# Patient Record
Sex: Female | Born: 1970 | Race: White | Hispanic: No | Smoking: Never smoker
Health system: Southern US, Community
[De-identification: ages and names within clinical notes are randomized; demographics above are authoritative.]

## PROBLEM LIST (undated history)

## (undated) DIAGNOSIS — E079 Disorder of thyroid, unspecified: Secondary | ICD-10-CM

## (undated) DIAGNOSIS — K219 Gastro-esophageal reflux disease without esophagitis: Secondary | ICD-10-CM

## (undated) DIAGNOSIS — I10 Essential (primary) hypertension: Secondary | ICD-10-CM

## (undated) DIAGNOSIS — I639 Cerebral infarction, unspecified: Secondary | ICD-10-CM

## (undated) HISTORY — PX: HERNIA REPAIR: SHX51

## (undated) HISTORY — PX: LYMPH NODE BIOPSY: SHX201

## (undated) HISTORY — PX: APPENDECTOMY: SHX54

## (undated) HISTORY — PX: TONSILLECTOMY: SUR1361

## (undated) HISTORY — PX: ABDOMINAL HYSTERECTOMY: SHX81

---

## 2002-04-21 ENCOUNTER — Encounter: Payer: Self-pay | Admitting: Urology

## 2002-04-21 ENCOUNTER — Encounter (INDEPENDENT_AMBULATORY_CARE_PROVIDER_SITE_OTHER): Payer: Self-pay | Admitting: *Deleted

## 2002-04-21 ENCOUNTER — Ambulatory Visit (HOSPITAL_COMMUNITY): Admission: RE | Admit: 2002-04-21 | Discharge: 2002-04-21 | Payer: Self-pay | Admitting: Urology

## 2002-06-09 ENCOUNTER — Inpatient Hospital Stay (HOSPITAL_COMMUNITY): Admission: RE | Admit: 2002-06-09 | Discharge: 2002-06-12 | Payer: Self-pay | Admitting: Urology

## 2002-06-09 ENCOUNTER — Encounter: Payer: Self-pay | Admitting: Urology

## 2002-08-25 ENCOUNTER — Encounter: Payer: Self-pay | Admitting: Urology

## 2002-08-25 ENCOUNTER — Ambulatory Visit (HOSPITAL_COMMUNITY): Admission: RE | Admit: 2002-08-25 | Discharge: 2002-08-25 | Payer: Self-pay | Admitting: Urology

## 2002-09-05 ENCOUNTER — Ambulatory Visit (HOSPITAL_COMMUNITY): Admission: RE | Admit: 2002-09-05 | Discharge: 2002-09-05 | Payer: Self-pay | Admitting: Urology

## 2004-02-29 ENCOUNTER — Ambulatory Visit (HOSPITAL_COMMUNITY): Admission: RE | Admit: 2004-02-29 | Discharge: 2004-02-29 | Payer: Self-pay | Admitting: Urology

## 2005-01-06 ENCOUNTER — Encounter: Admission: RE | Admit: 2005-01-06 | Discharge: 2005-01-06 | Payer: Self-pay | Admitting: Urology

## 2005-01-19 ENCOUNTER — Encounter: Admission: RE | Admit: 2005-01-19 | Discharge: 2005-01-19 | Payer: Self-pay | Admitting: General Surgery

## 2006-11-26 ENCOUNTER — Ambulatory Visit (HOSPITAL_BASED_OUTPATIENT_CLINIC_OR_DEPARTMENT_OTHER): Admission: RE | Admit: 2006-11-26 | Discharge: 2006-11-26 | Payer: Self-pay | Admitting: Urology

## 2007-08-26 ENCOUNTER — Ambulatory Visit (HOSPITAL_BASED_OUTPATIENT_CLINIC_OR_DEPARTMENT_OTHER): Admission: RE | Admit: 2007-08-26 | Discharge: 2007-08-26 | Payer: Self-pay | Admitting: Urology

## 2010-12-06 ENCOUNTER — Encounter: Payer: Self-pay | Admitting: General Surgery

## 2011-03-31 NOTE — Op Note (Signed)
NAMEBEYZA, BELLINO             ACCOUNT NO.:  [id number]   MEDICAL RECORD NO.:  [id number]          PATIENT TYPE:  AMB   LOCATION:  [facility]                         FACILITY:  [facility]   PHYSICIAN:  Jamison Neighbor, M.D.  DATE OF BIRTH:  1971/01/26   DATE OF PROCEDURE:  DATE OF DISCHARGE:                               OPERATIVE REPORT   PREOPERATIVE DIAGNOSIS:  Malfunctioning InterStim.   POSTOPERATIVE DIAGNOSIS:  Malfunctioning InterStim.   PROCEDURE:  Replacement first and second stage InterStim   SURGEON:  Jamison Neighbor, M.D.   ANESTHESIA:  Local with IV sedation.   COMPLICATIONS:  None.   DRAINS:  Foley catheter, removed postoperatively.   BRIEF HISTORY:  A 40 year old female with interstitial cystitis, has an  InterStim placed for the management of urgency, frequency and urge  incontinence.  The patient had some deterioration in her symptoms and  when we got an x-ray, we saw that the lead had migrated laterally.  The  patient has had over a 50-pound weight loss and it was thought that this  might make the lead move.  We also noted that it is now quite visible on  the skin surface and she is uncomfortable from the device being  superficial.  The patient is now to undergo replacement of the device  with a new first and second stage to see if we get her symptoms under  better control.  She understands the risks and benefits of the procedure  and gave full informed consent.   PROCEDURE:  After adequate IV sedation, the patient was placed in the  prone position.  She was prepped for a full 10-minutes with scrub and  paint.  She received preoperative Ancef and gentamicin.  She was prepped  with sterile towels as well as a Vi-Drape.  The previous incision was  opened and the generator was identified.  It was removed from the  pocket.  Fluoroscopy was used to identify the approximate level of the  third sacral foramen, which was marked out on the skin surface.  The old  lead was  removed.  A foramen needle was passed into what was first  thought to be the third sacral foramen on each side.  Testing, however,  did not show dorsiflexion of the great toe or a bellows effect, and  imaging made it look as if this was most likely S4.  The needles were  then moved one foramen up and a good bellows effect was obtained  bilaterally.  There was a better affect and a better image on the left-  hand side and it was decided to go ahead and use that lead.  A small  incision was made and a wire was passed down through the needle, which  was removed.  The dilating trocar was then passed over the wire and the  wire was then removed.  The quadripolar lead was then passed down  through the trocar and placed down within the foramen.  It was  positioned so that the optimal bellows effect was obtained primarily on  leads II and III.  This was then  tunneled over to the old incision.  The  device was not placed into the same pocket in order to prevent biofilm  contamination.  The area had been irrigated with antibiotic solution.  The connection was made and the device was placed in the new  pocket.  The incision was closed with 2-0 Vicryl as well as 3-0 nylon.  The patient tolerated the procedure well and was taken to the recovery  room in good condition.  She was sent home with Keflex and Lorcet Plus.  She will return to see me in routine follow-up in 1 week for suture  removal.      Jamison Neighbor, M.D.  Electronically Signed     RJE/MEDQ  D:  08/26/2007  T:  08/27/2007  Job:  [id number]

## 2011-04-03 NOTE — Discharge Summary (Signed)
Marisa Hoffman, Marisa Hoffman                       ACCOUNT NO.:  [id number]   MEDICAL RECORD NO.:  [id number]                   PATIENT TYPE:  INP   LOCATION:  0352                                 FACILITY:  [facility]   PHYSICIAN:  Jamison Neighbor, M.D.               DATE OF BIRTH:  15-Feb-1971   DATE OF ADMISSION:  06/09/2002  DATE OF DISCHARGE:  06/12/2002                                 DISCHARGE SUMMARY   ADMISSION DIAGNOSIS:  Pelvic mass.   SECONDARY DIAGNOSES:  1. Interstitial cystitis.  2. Hypertension.  3. Esophageal reflux.  4. Fibromyalgia.  5. Depression.   DISCHARGE DIAGNOSES:  1. Multiple pelvic adhesions.  2. Interstitial cystitis.  3. Hypertension.  4. Esophageal reflux.  5. Fibromyalgia.  6. Depression.   PROCEDURE:  Abdominal exploration, lysis of adhesions, cystoscopy, and  ureteral catheterization done on the day of admission.   HISTORY OF PRESENT ILLNESS:  This 40 year old female is referred to me for  evaluation of chronic abdominal pain thought to be due to IC.  The patient  underwent cystoscopy which demonstrated that she did indeed have IC, but had  no improvement in her pain.  Careful evaluation showed that the pain is  localized on the left-hand side, and we did recommend that she undergo a CT  scan.  The CT scan showed that she had a probable adnexal mass.  This was  described as a cystic obstruction on the left-hand side, possibly involving  the bladder or ureter.  The patient is now to undergo abdominal exploration  to determine the etiology of this lesion.   PAST MEDICAL HISTORY:  1. Interstitial cystitis.  2. Irritable bowel syndrome.  3. Hypertension.  4. Fibromyalgia.   PAST SURGICAL HISTORY:  1. Cesarean section.  2. D&C.  3. Hysterectomy.  4. Cholecystectomy.  5. Multiple cystoscopies.    MEDICATIONS:  1. Elmiron.  2. Bextra.  3. Allegra.  4. Propanolol.  5. Klonopin.  6. Lipitor.  7. Prevacid.  8. Cystospaz.  9. Reglan.  10.      Amitriptyline.  11.      BuSpar.  12.      Effexor.  13.      Lorcet.  14.      Estradiol.  15.      Nasacort.  16.      Maalox.   PHYSICAL EXAMINATION:  The patient's physical examination is consistent with  the above description with the only positive finding being significant left  lower quadrant pain secondary to possible mass and possible adhesions.   HOSPITAL COURSE:  The patient was taken to the operating room on 06/09/02,  where exploration was performed.  She had a cysto and stent placed in order  to allow visualization of the ureter.  Adhesions were taken down, and it  became clear that what was described as being a cystic mass was simply a  fluid collection surrounded by  adhesed bowel, all of which was cleared after  the adhesions had been taken down.  The patient had an unremarkable  postoperative period.  She actually felt much better once the adhesions had  been taken down, and felt that the pain that she had prior to admission had  essentially cleared.  The patient was felt to be ready for discharge by  06/12/02.  Her pain was well controlled.  The IC pain and incisional pain  were still present, but the left lower quadrant pain that necessitated the  surgery had cleared.  She was eating without difficulty, and ambulating  without difficulty, and bowels were moving.  The patient was discharged with  no new prescriptions other than Lorcet 10 for pain.  She will stay on all of  her other present medications.   FOLLOWUP:  She will return to me for followup for staple removal.                                                Jamison Neighbor, M.D.    RJE/MEDQ  D:  07/06/2002  T:  07/06/2002  Job:  [id number]   cc:   Raquel James, M.D.  [place], [place]

## 2011-04-03 NOTE — Op Note (Signed)
Marisa Hoffman, BOGGESS                       ACCOUNT NO.:  [id number]   MEDICAL RECORD NO.:  [id number]                   PATIENT TYPE:  AMB   LOCATION:  DAY                                  FACILITY:  [facility]   PHYSICIAN:  Jamison Neighbor, M.D.               DATE OF BIRTH:  December 06, 1970   DATE OF PROCEDURE:  08/25/2002  DATE OF DISCHARGE:                                 OPERATIVE REPORT   PREOPERATIVE DIAGNOSES:  1. Interstitial cystitis.  2. Urgency incontinence.  3. Intermittent retention.   POSTOPERATIVE DIAGNOSES:  1. Interstitial cystitis.  2. Urgency incontinence.  3. Intermittent retention.   PROCEDURE:  First stage InterStim implant (two leads).   SURGEON:  Jamison Neighbor, M.D.   ANESTHESIA:  IV sedation plus local.   COMPLICATIONS:  None.   DRAINS:  None.   BRIEF HISTORY:  This 40 year old female is completely disabled from her  lower urinary tract symptoms.  She has chronic interstitial cystitis.  Her  pain is so severe that she does have episodes of urinary retention secondary  to pelvic floor dysfunction.  She also has marked urgency incontinence which  has not responded to medical management.  The patient is an excellent  candidate for Inter-Stim implantation.  She understands the risks and  benefits of the procedure.  She realizes this is a first stage approach, and  there is no guarantee that the implant will work as hoped for and that if  she does not respond, the second stage implant will not be placed.  She is  aware of the fact that she will have external wires exiting and will be  required to monitor her results in the postoperative period to determine if  the second stage is appropriate.  Full and informed consent was obtained.  She has taken preoperative antibiotics and had Hibiclens showers in  preparation for the procedure.   DESCRIPTION OF PROCEDURE:  After the successful induction of general  anesthesia, the patient was placed in the prone  position.  She was given  Ancef and gentamycin.  She had a full 10 minute prep with scrub and paint.  The patient was then secured to the table with tape opening up the buttocks  to allow the external sphincter to be visualized.  Standard drapes were  applied along with a Vi-Drape.  Fluoroscopy was used to identify the level  of the third sacral foramen.  A needle was passed on both sides, and  fluoroscopy seemed to indicate that this was a good placement.  The patient  had a better bellows effect on the left-hand side as opposed to the right.  A small incision was made, and the dilator system was then used to dilate a  track down through the incision to the foramen.  The quadripolar lead was  then partially deployed and positioned using the dilator system so that the  patient had a bellows  effect on leads 1, 2, and 3, with some lower effect on  lead 0.  This was considered to be optimal placement based on both the  patient's effect as well as the fluoroscopic appearance on the lateral film.  A PA film was taken, and it did appear that this was somewhat lower than one  normally sees with the third sacral foramen, and there was concern that  perhaps this was in the fourth sacral foramen.  For that reason, a second  lead was then placed one foramen higher.  The patient had a nearly identical  response in terms of bellows effect, making it difficult to ascertain on  clinical grounds which of these was actually the more optimal lead  placement.  The decision was made to leave a quadripolar lead on each side.  The second more superiorly was deployed in an identical fashion.  A small  incision was made out laterally, and the two leads were brought out through  this lateral incision.  Both leads were then connected to lead extenders  using the standard connection, and these were covered with a protective  booty.  The booties were tied down with silk sutures.  The lower of the two  leads was  brought out through a puncture hole on the right buttocks.  The  higher one was brought out just above that lead, so that it will be easy to  identify which lead works better for the patient.  The connections were then  placed down within the incision within a small pocket that was made.  The  two paramedian incisions were closed with a single stitch of nylon suture.  The lateral incision was closed with two stitches of nylon suture placed in  a mattress fashion.  The patient had a dressing applied.  She tolerated the  procedure well and was taken to the recovery room in good condition.                                               Jamison Neighbor, M.D.    RJE/MEDQ  D:  08/25/2002  T:  08/25/2002  Job:  [id number]   cc:   Grace Bushy, M.D.  [place], [place]

## 2011-04-03 NOTE — Op Note (Signed)
[facility]  Patient:    Marisa Hoffman, Marisa Hoffman Visit Number: [id number] MRN: [id number]          Service Type: Attending:  Jamison Neighbor, M.D. Dictated by:   Jamison Neighbor, M.D. Proc. Date: 04/21/02   CC:         Alycia Patten, M.D., [place], [place]   Operative Report  PREOPERATIVE DIAGNOSIS:  Interstitial cystitis.  POSTOPERATIVE DIAGNOSIS:  Interstitial cystitis.  PROCEDURE: 1. Cystoscopy. 2. Urethral calibration. 3. Hydrodistention of the bladder, 4. Marcaine and Pyridium instillation, 5. Marcaine and Kenalog injection.  SURGEON:  Jamison Neighbor, M.D.  ANESTHESIA:  General.  COMPLICATIONS:  None.  DRAINS:  None.  BRIEF HISTORY:  This 40 year old female was first evaluated in October 2002 and then once again in April 2003.  The patient has severe interstitial cystitis and has not responded to a very aggressive medical management protocol.  The patient elected to just stop everything.  She had previously been on a program of Neurontin, hydroxyzine, Singulair, Flexeril, Elmiron, Lortab, Celexa, Prevacid, Estradiol, Lipitor, and propanolol.  This did make her somewhat sedated, and obviously this was more medication than she probably requires, but I have stressed to her that she really needs to be on a better thought out plan, looking at something along the lines of hydroxyzine, Elmiron, and Elavil along with whatever other medications such as Pyridium Plus and pain medications she needs for symptomatic management.  It was thought that the patient would be a candidate for InterStim implant since she has poorly-controlled urgency incontinence and frequency, but Medicaid will not cover that device.  The patient is interested in a repeat hydrodistention to determine if she is a candidate for any additional experimental drug trials.  We do feel that she may very well be a candidate for RTX.  The patient is to undergo diagnostic cystoscopy to  determine if she will qualify. She understands the risks and benefits of the procedure.  We hope that she will get some symptomatic relief, but she realizes there is no guarantee that that will occur.  In addition, she knows that she will certainly have increasing pain for the next week or two following the procedure itself.  DESCRIPTION OF PROCEDURE:  After the successful induction of general anesthesia, the patient was placed in the dorsal lithotomy position and prepped with Betadine and draped in the usual sterile fashion.  Careful bimanual examination revealed no abnormalities of the urethra.  Specifically, no evidence of a diverticulum.  There was no real cystocele, rectocele, or enterocele.  There were no mass on bimanual exam.  The uterus was palpably normal. There were no adnexal masses.  The urethra was calibrated to 32 with female urethral sounds  There were no signs of stenosis or stricture.  The cystoscope was inserted.  The bladder was carefully inspected.  It was free of any tumor or stones.  Both ureteral orifices were normal in configuration and location.  The bladder was distended at a pressure of 100 cm of water for five minutes.  When the bladder was drained, the patient had a bladder capacity of just 500 cc.  There was marked bleeding and glomerulation formation throughout all four quadrants consistent with a fairly marked case of interstitial cystitis.  A bladder biopsy was sent for mast cell analysis which may lead [place] to use higher doses of antihistamine.  The patient had the biopsy site cauterized.  She had an instillation of Marcaine and Pyridium.  She was given intraoperative  Toradol and Zofran as well as a B&O suppository.  She had an Marcaine and Kenalog periurethrally.  The patient will be sent home today with Lorcet Plus, Pyridium Plus, and Levaquin.  We will bring her back and, as described above, will encourage her to get on a standard medical protocol  but will try to eliminate some of the more sedating aspects of the drugs.  She is a candidate for the RTX trial.  Unfortunately, that is the only clinical trial for IC open at this time. Dictated by:   Jamison Neighbor, M.D. Attending:  Jamison Neighbor, M.D. DD:  04/21/02 TD:  04/24/02 Job: [id number]

## 2011-04-03 NOTE — Op Note (Signed)
Marisa Hoffman, Marisa Hoffman             ACCOUNT NO.:  [id number]   MEDICAL RECORD NO.:  [id number]          PATIENT TYPE:  AMB   LOCATION:  [facility]C                         FACILITY:  [facility]   PHYSICIAN:  Jamison Neighbor, M.D.  DATE OF BIRTH:  Nov 04, 1971   DATE OF PROCEDURE:  11/26/2006  DATE OF DISCHARGE:                               OPERATIVE REPORT   SERVICE:  Urology.   PREOPERATIVE DIAGNOSIS:  Interstitial cystitis.   POSTOPERATIVE DIAGNOSIS:  Interstitial cystitis.   PROCEDURE:  Cystoscopy, urethral calibration, hydrodistention of the  bladder, Marcaine and Pyridium installation, Marcaine and Kenalog  injection.   SURGEON:  Dr. Logan Bores.   ANESTHESIA:  General.   COMPLICATIONS:  None.   DRAINS:  None.   BRIEF HISTORY:  This 40 year old female has a severe case of  interstitial cystitis.  The patient had an InterStim placed in the past  and had a nice response in terms of her frequency and urgency. Recently  however, she has had some problems with the device and says that she  feels it is causing some problems with her bowels and she feels it may  be adding some pain.  PA and lateral films showed good placement of the  lead in S3 but there has been some medial to lateral migration of the  wire. The patient has been reprogrammed with some improvement but she is  having severe issues with both her irritable bowel syndrome and her  interstitial cystitis.  The patient has numerous medications but despite  that has chronic abdominal pain, chronic pelvic pain and frequency and  urgency. The patient has asked that she undergo a repeat  hydrodistention. She elected as opposed to formal revision of the  InterStim lead because she has responded in the past.  She is fully  aware of the fact that there is no guarantee that she will have  improvement and if she does have improvement it certainly is not  permanent. She has agreed that if this does not work, she will consider  InterStim  revision and our plan will be for cystoscopy and  hydrodistention today along with continuation or her other medications.  The patient gave full informed consent.   PROCEDURE:  After successful induction of general anesthesia, the  patient was placed in the dorsal lithotomy position, prepped with  Betadine and draped in the usual sterile fashion. Support for urethra  was reasonable, there was no signs of a diverticulum or any stenosis.  She accepted a 32-French female urethral sound with no signs of  stricture.  The patient had no real prolapse, the vault was well-  supported.  There was no discharge of any kind and her pelvic exam was  felt to be normal. The cystoscope was inserted, the bladder was  carefully inspected and was free of any tumor or stones.  Both ureteral  orifices were normal in configuration and location. Clear urine was seen  to efflux from each. Hydrodistention of the bladder was performed.  The  bladder was distended at a pressure of 100 cmH2O for 5 minutes. When the  bladder was drained, glomerulations  could be seen throughout the  bladder.  The patient had a bladder capacity of 575 which is the average  capacity for an interstitial cystitis patient but is markedly less than  the normal bladder capacity which would be approximately 1150 under  anesthesia.  The patient had nothing that requires biopsy. The bladder  was drained and a mixture of Marcaine and Pyridium was left in the  bladder, Marcaine and Kenalog were injected periurethrally. The patient  tolerated the procedure well and was taken to the recovery room in good  condition.  She will return to the office in follow-up at which point we  will make a decision as to whether she is going to need any additional  InterStim revision. I have given and Pyridium Plus as well as a small  number of Percocet.  She will return back to her standard pain medicine  of Lorcet Plus once that has been completed. She does have  Cipro at home  for a short course of antibiotic coverage.           ______________________________  Jamison Neighbor, M.D.  Electronically Signed     RJE/MEDQ  D:  11/26/2006  T:  11/26/2006  Job:  [id number]

## 2011-04-03 NOTE — Op Note (Signed)
Marisa Hoffman, Marisa Hoffman                       ACCOUNT NO.:  [id number]   MEDICAL RECORD NO.:  [id number]                   PATIENT TYPE:  AMB   LOCATION:  DAY                                  [facility]ACILITY:  [facility]   PHYSICIAN:  Jamison Neighbor, M.D.               DATE OF BIRTH:  03/29/1971   DATE OF PROCEDURE:  09/05/2002  DATE OF DISCHARGE:                                 OPERATIVE REPORT   PREOPERATIVE DIAGNOSIS:  Urgency and frequency syndrome.   POSTOPERATIVE DIAGNOSIS:  Urgency and frequency syndrome.   PROCEDURE:  Revision of first stage InterStim implant and second stage  implant.   SURGEON:  Jamison Neighbor, M.D.   ANESTHESIA:  General.   COMPLICATIONS:  None.   DRAINS:  None.   HISTORY:  This 40 year old female had a first stage InterStim implant placed  two weeks ago.  The patient has had a remarkable response to the InterStim  with excellent control of her urgency and frequency.  She is now sleeping  through the night.  Her pain has gotten better, and this is the best she has  felt in many years.  The patient had two first stage leads placed because of  variable response, and it turned out that the lower of the leads turned out  to be the better of the two.  The patient is now to undergo revision of the  upper lead as well as attachment of the lower lead to the [facility] generator.  She understands the risks of the procedure and gave full and informed  consent.   DESCRIPTION OF PROCEDURE:  The patient was brought to the operating room  table in the prone position and was then given a four minute prep and drape.  She was preoperative antibiotic therapy with Ancef and gentamycin.  She then  was prepped and draped.  The existing wires were clipped at the level of the  skin and were clamped in an effort to prevent them from being pulled into  the incision.  A Vi-Drape was placed.  The lateral incision was then opened  and extended, and the connectors to both leads were  identified.  They were  both freed from the surrounding tissue with electrocautery.  The leads were  then tugged on in order to determine which one was the proximal and which  one was the distal lead.  The lower of the two leads was then disconnected  and was then connected to the lead extender in the IPG in the usual fashion.  The booty was placed and tied down with silk sutures.  The external portion  of that lead was then cut and removed.  The other lead was cut, but the lead  itself was left in place in case in a future time, the patient requires that  lead for additional stimulation.  The incision was then deepened, and a  pocket was made.  The entire area was irrigated out with antibiotic  solution.  Hemostasis was obtained.  The IPG was placed with the writing  side up in the depths of the pocket.  The incision was then closed with 2-0  Vicryl and surgical clips.  The patient tolerated the procedure well and was  taken to the recovery room in good condition.                                               Jamison Neighbor, M.D.    RJE/MEDQ  D:  09/05/2002  T:  09/05/2002  Job:  [id number]

## 2011-04-03 NOTE — H&P (Signed)
[facility]  Patient:    Marisa Hoffman, [place] Visit Number: [id number] MRN: [id number]          Service Type: SUR Location: 3W 0352 01 Attending Physician:  Londell Moh Dictated by:   Jamison Neighbor, M.D. Admit Date:  06/09/2002   CC:         Dr. Raquel James   History and Physical  SERVICE:  Urology.  ADMITTING DIAGNOSES:  1. Interstitial cystitis.  2. Chronic irritable bowel syndrome.  3. Chronic pelvic pain.  4. Possible adnexa mass.  5. Hypertension.  6. Tachycardia.  7. Depression.  8. Migraines.  9. Possible hyperthyroidism. 10. Esophageal reflux disease. 11. Fibromyalgia.  HISTORY OF PRESENT ILLNESS:  This 40 year old female was initially referred to me for evaluation of chronic abdominal pain, thought to be due to severe interstitial cystitis. The patient underwent a cystoscopic examination which demonstrated she did indeed have this disease, but unfortunately she had no real improvement in her symptoms. The patient is being considered for numerous long-term of options for the management of her chronic pain, which includes experimental drug trials, InterStim implantation and have even included discussion of possible cystectomy. The patient cannot have the InterStim implanted because she has Medicaid and they do not cover this. The patient may be a candidate for the experimental drug trials if her pain does not improve.  After the patients recent cystoscopy and hydrodistention, she presented to the emergency room in her home town where a CT scan was obtained and she was told she had adnexa mass. This was reviewed and ultrasound again showed there did appear to e a cystic obstruction in the left side. This was thought to possible involve the bladder or ureter. The patient is to be admitted following abdominal exploration to determine the etiology of this lesion.  PAST MEDICAL HISTORY: Remarkable for her interstitial cystitis,  irritable bowel syndrome and fibromyalgia. The patient also suffers from hypertension, tachycardia, chronic allergies, migraines, depression, anxiety, hyperthyroidism, esophageal reflux disease.  PAST SURGICAL HISTORY: C-sections x2, D&C, hysterectomy, cholecystectomy, and multiple cystoscopies.  CURRENT MEDICATIONS:  1. Elmiron 200 mg b.i.d.  2. Bextra 10 mg daily.  3. Allegra 60 mg b.i.d.  4. Propranolol 20 mg 2 b.i.d.  5. Klonopin 0.5 mg 1/2 b.i.d.  6. Lipitor 40 mg daily.  7. Prevacid 30 mg daily.  8. Cystospaz on a p.r.n. basis.  9. Reglan 10 mg t.i.d. 10. Amitriptyline 75 mg at night. 11. BuSpar 5 mg 2 b.i.d. 12. Effexor 75 mg daily. 13. Lorcet as needed. 14. Estradiol 10 mg daily. 15. Nasacort 2 puffs daily. 16. Maalox p.r.n.  SOCIAL HISTORY: The patient does not use tobacco or alcohol and she follows an interstitial cystitis diet. She last had any tobacco products in 1990.  FAMILY HISTORY: Noncontributory.  REVIEW OF SYSTEMS: Pertinent for chronic headache, chronic congestion, urgency, frequency, nocturia to four times a night, chronic joint pain including chronic pain in the neck.  PHYSICAL EXAMINATION:  GENERAL: On examination, this is a well-developed, well-nourished female.  HEENT: Examination was normocephalic and atraumatic. Cranial nerves II-XII appear grossly intact.  NECK: Supple. No adenopathy or thyromegaly.  LUNGS: Clear.  HEART: Regular rate and rhythm. No murmurs, thrills, gallops, rubs, or heaves.  ABDOMEN: Soft and she had multiple tender points across the lower portion of the abdomen.  PELVIC: Examination showed no cystocele, rectocele or enterocele. There are no mass on bimanual examination.  EXTREMITIES: No clubbing, cyanosis, or edema.  IMPRESSION: Left lower quadrant pain  secondary to possible mass, possible adhesions.  PLAN: Pelvic exploration. Dictated by:   Jamison Neighbor, M.D. Attending Physician:  Londell Moh DD:   06/09/02 TD:  06/13/02 Job: [id number]

## 2011-04-03 NOTE — Op Note (Signed)
[facility]  Patient:    Marisa Hoffman, P[place]SYL Visit Number: [id number] MRN: [id number]          Service Type: SUR Location: 3W 0352 01 Attending Physician:  Londell Moh Dictated by:   Jamison Neighbor, M.D. Proc. Date: 06/09/02 Admit Date:  06/09/2002   CC:         Dr. Janeann Merl, Ardelle Balls, [place]   Operative Report  PREOPERATIVE DIAGNOSIS:  Chronic abdominal pain with cystic mass identified on CT.  POSTOPERATIVE DIAGNOSIS:  Chronic abdominal pain secondary to multiple pelvic adhesions.  PROCEDURES: 1. Cystoscopy. 2. Placement of left ureteral catheter. 3. Pelvic exploration. 4. Lysis of adhesions.  SURGEON:  Jamison Neighbor, M.D.  ASSISTANT:  Lindaann Slough, M.D.  ANESTHESIA:  General.  COMPLICATIONS:  None.  DRAINS:  Foley catheter.  BRIEF HISTORY:  This patient was originally referred to me for evaluation of interstitial cystitis.  The patient has such severe urgency and frequency that she is felt to be a potential candidate for Interstim implantation. Unfortunately, her Medicaid insurance will not cover this device.  The patient has been on very aggressive medical management, including Pyridium Plus, Neurontin, _____, Singulair, Flexeril, Elmiron, Celexa, Prevacid, estradiol, Lipitor, propranolol, and pain medications.  The patient underwent cystoscopy and hydrodistention without much improvement in her symptoms.  The patient subsequently presented to the emergency room in her home town of [place], where a CT scan was obtained thinking she might have a stone.  The patient was found to have a fluid collection in the left adnexa measuring 3 x 4 of undetermined etiology.  This was in the exact location of the patients pain. The patient does not have a palpable mass, but it should be noted that her body habitus makes it difficult to define the anatomy in that region.  The patients pain is in that general area, and for that reason  she is to undergo exploration to determine if this mass, which is next to her bladder, involves the bladder or ureter.  She gave full and informed consent.  DESCRIPTION OF PROCEDURE:  After successful induction of general anesthesia, the patient was placed in the dorsal lithotomy position and prepped with Betadine and draped in the usual sterile fashion.  Cystoscopy was performed. The bladder was carefully inspected and was free of any tumor or stone. Hydrodistention was not performed, but it is known from previous examination that the patient has a very small-capacity bladder.  The ureteral orifice was identified and using fluoroscopy, a stent was placed up to the kidney.  This was secured to a Foley catheter.  The patient was then placed in the supine position and prepped with Betadine and draped in the usual sterile fashion.  A previous Pfannenstiel incision was utilized and was used to allow entry into the abdominal cavity.  The _____ incision was somewhat larger than when the patient had her hysterectomy.  As the fascia was opened, there were numerous adhesions of the omentum to the underside of the abdominal wall, all of which were carefully taken down.  The Bookwalter retractor was placed.  The bowel contents were brought up superiorly.  The sigmoid colon was found to be tethered to the bladder.  These attachments were taken down.  The colon was also stuck to the sidewall on the left-hand side.  As these adhesions were taken down and the sigmoid was freed up, it was clear that there was a cystic potential space where the bowel had been trapped, and that  was in the general area where the ultrasound had demonstrated the 3 x 4 cm cystic structure.  The ovary was identified.  It was carefully inspected and was found to be free of any lesions.  The ureter was identified and was unremarkable.  The retroperitoneal space as well as the peritoneal space adjacent to the bladder was carefully  inspected and was found to be normal as well.  The bowel was traced all the way down to a point underneath the vagina, and that was free of any lesions.  It was felt that the mass seen on the previous studies was where the adhesed sigmoid colon had been tethered down, causing a potential space thought inadvertently by the radiologist to be an adnexal mass.  After all the adhesions had been taken down, the sigmoid colon was placed in a more appropriate position, and it is hoped that this will cause the patient to have less pain.  The rectus muscles were closed in the midline with Vicryl sutures. The incision itself was then closed with a running suture of #1 PDS and surgical clips.  The patients Foley catheter was left to straight drainage, but the stent was removed.  The patient tolerated the procedure well and was taken to the recovery room in good condition.  The Foley catheter will be left in for a day or so.  The patient will be sent home once she is stable. Dictated by:   Jamison Neighbor, M.D. Attending Physician:  Londell Moh DD:  06/09/02 TD:  06/13/02 Job: [id number]

## 2011-04-03 NOTE — Op Note (Signed)
NAMEENVI, Marisa Hoffman                       ACCOUNT NO.:  [id number]   MEDICAL RECORD NO.:  [id number]                   PATIENT TYPE:  AMB   LOCATION:  DAY                                  FACILITY[facility]  [facility]   PHYSICIAN:  Jamison Neighbor, M.D.               DATE OF BIRTH:  [birth year]   DATE OF PROCEDURE:  02/29/2004  DATE OF DISCHARGE:  02/29/2004                                 OPERATIVE REPORT   PREOPERATIVE DIAGNOSES:  Interstitial cystitis, Interstim malfunction.   POSTOPERATIVE DIAGNOSES:  Interstitial cystitis, Interstim malfunction.   PROCEDURE:  Interstim revision including old lead removal and placement of  new lead.   SURGEON:  Jamison Neighbor, M.D.   ASSISTANT:  Thyra Breed, MD   ANESTHESIA:  General endotracheal.   COMPLICATIONS:  None.   DRAINS:  Foley catheter removed in the PACU.   BRIEF HISTORY:  This is a 40 year old female documented in the past with  interstitial cystitis unable to obtain relief from her symptomatology from a  medical regimen.  Following placement of her Interstim device, she was doing  very well and significantly improved to the point she was no longer taking  pain medicine.  However, in early March, 2005, the patient took a fall and  said she lost her sensation.  The unit was then reprogrammed but the  Interstim device would no longer function properly.  Therefore the patient  is brought to the operating room today for probable new lead placement and  removal of her old leads with testing of her generator and reprogramming of  her Interstim device. The patient understands the risks, benefits, and  alternatives of this procedure and is willing to proceed.   DESCRIPTION OF PROCEDURE:  Following identification by her arm bracelet, the  patient was brought to the operating room. She was placed in the supine  position initially and after adequate IV sedation the patient was moved to  the prone position.  All pressure points were adequately  padded. The  buttocks were then pulled apart with tape in order to expose the anal  sphincter. The patient then received a full 10 minute prep with Betadine  soap scrub as well as Betadine paint. The patient was then draped in the  usual fashion including a Vi-drape.  Initial 5% Marcaine solution was then  injected into the skin prior to skin incision at the site of the old scar  from previous generator placement.  Once adequately anesthetized, the 15  blade scalpel was used to carry the incision down to the subcutaneous level  just above the generator.  Bovie electrocautery was used to obtain excellent  hemostasis and cauterize any subcutaneous bleeders.  The Bovie was then used  to cut through the pseudocapsule containing the generator and two leads  attached.  The generator was then delivered from the subcutaneous pocket and  an antibiotic soaked sponge was placed into the area.  The generator was  then removed from both leads.  The generator was then cleaned thoroughly and  tested to ensure the unit was still functioning properly and had adequate  battery reserve. Fluoroscopy was then used to identify the placement of the  previous two leads.  Both leads were then removed in their entirety through  the incision.  Spot fluoroscopy then showed no lead material remaining  within their tracks.  Utilizing fluoroscopy then a foramen needle was then  passed through what was thought to be the S3 foramen on the patient's right  side.  Testing on this lead showed that it was in fact in the third sacral  foramen with only minimal response on lead 2 and 3 including a bellows  effect.  A second foramen needle was passed about 1-2 cm above the first  needle also into the S3 foramen. This angle left trajectory appeared to be  somewhat better on both AP and lateral views fluoroscopically. Testing  revealed excellent bellows response with no dorsiflexion of the great toe in  leads 1, 2, and 3.  One  final lead was passed through the S3 foramen on the  left side. We then passed one final foramen needle through the S3 foramen on  the patient's right hand side.  There was excellent bellows effect without  dorsiflexion of the great toe; however, this needle was removed as we  decided to use the first needle on the same side as the generator.  An AP  film then showed a very appropriate medial to lateral curve to the needle  and it appeared it was optimally placed both on AP and lateral films.  The  guidewire was then passed down through the foramen needle and a small skin  incision was made around the needle. The dilating trocar was then passed  over the guidewire under fluoroscopic guidance.  The quadripolar lead was  then passed down through the trocar but the tines were not deployed.  The  lead was then positioned so that the bottom portion of the sacrum was  between leads 1 and 2.  In this position, leads 1, 2 and 3 all gave  excellent bellows effect and none gave dorsiflexion of the great toe.  Final  films were then obtained which confirmed good placement. The tunneling tool  was then used to make a tunnel from the paramedian small incision to the  initial incision where the generator was found.  The antibiotic soaked  sponge was removed from the pocket that would house the IPG.  The connection  was then made from the new quadripolar lead to an external lead extender  which was brought out through a small __________.  The protective booty was  then applied and it was tied down with four stitches of 1-0 silk. The  connection as well as the IPG were then placed within the pocket. This was  irrigated with antibiotic solution copiously.  The incision was then closed  in two layers. The subcutaneous tissue was reapproximated using a running 2-  0 Vicryl suture.  The skin was then closed with two 3-0 nylon vertical  mattress sutures. One vertical mattress suture was also placed in  the paramedian incision as well.  Sterile dressings were then applied to both  incisions.  The patient tolerated the procedure well and there were no  complications.  Please note that Dr. Logan Bores was present and participated in  the entire procedure as he was the responsible surgeon.  DISPOSITION:  After waking from general anesthesia, the patient will be  discharged to home. The patient was transported to the post anesthesia care  unit in stable condition. From here, she will be discharged to home.  She  will be instructed on how to use the Interstim.  She was given prescriptions  for Lortab 10 #40 as well as a 5 day prescription for Keflex.  In addition,  the patient desired a prescription for nausea. She was given Zofran instead  of Phenergan per the patient's request.  I also refilled her Elmiron.     Thyra Breed, MD                            Jamison Neighbor, M.D.    EG/MEDQ  D:  02/29/2004  T:  02/29/2004  Job:  [id number]

## 2011-08-27 LAB — POCT I-STAT 4, (NA,K, GLUC, HGB,HCT)
HCT: 38
Potassium: 3.3 — ABNORMAL LOW
Sodium: 138

## 2015-12-26 ENCOUNTER — Emergency Department (HOSPITAL_BASED_OUTPATIENT_CLINIC_OR_DEPARTMENT_OTHER): Payer: Medicaid Other

## 2015-12-26 ENCOUNTER — Encounter (HOSPITAL_BASED_OUTPATIENT_CLINIC_OR_DEPARTMENT_OTHER): Payer: Self-pay | Admitting: *Deleted

## 2015-12-26 ENCOUNTER — Emergency Department (HOSPITAL_BASED_OUTPATIENT_CLINIC_OR_DEPARTMENT_OTHER)
Admission: EM | Admit: 2015-12-26 | Discharge: 2015-12-26 | Disposition: A | Discharge status: Home / Self Care | Payer: Medicaid Other | Attending: Emergency Medicine | Admitting: Emergency Medicine

## 2015-12-26 DIAGNOSIS — Z7982 Long term (current) use of aspirin: Secondary | ICD-10-CM | POA: Diagnosis not present

## 2015-12-26 DIAGNOSIS — J069 Acute upper respiratory infection, unspecified: Secondary | ICD-10-CM | POA: Diagnosis not present

## 2015-12-26 DIAGNOSIS — R110 Nausea: Secondary | ICD-10-CM | POA: Diagnosis not present

## 2015-12-26 DIAGNOSIS — K219 Gastro-esophageal reflux disease without esophagitis: Secondary | ICD-10-CM | POA: Insufficient documentation

## 2015-12-26 DIAGNOSIS — E079 Disorder of thyroid, unspecified: Secondary | ICD-10-CM | POA: Diagnosis not present

## 2015-12-26 DIAGNOSIS — R079 Chest pain, unspecified: Secondary | ICD-10-CM | POA: Insufficient documentation

## 2015-12-26 DIAGNOSIS — Z79899 Other long term (current) drug therapy: Secondary | ICD-10-CM | POA: Insufficient documentation

## 2015-12-26 DIAGNOSIS — Z91040 Latex allergy status: Secondary | ICD-10-CM | POA: Insufficient documentation

## 2015-12-26 DIAGNOSIS — Z8673 Personal history of transient ischemic attack (TIA), and cerebral infarction without residual deficits: Secondary | ICD-10-CM | POA: Diagnosis not present

## 2015-12-26 DIAGNOSIS — I10 Essential (primary) hypertension: Secondary | ICD-10-CM | POA: Insufficient documentation

## 2015-12-26 DIAGNOSIS — R05 Cough: Secondary | ICD-10-CM | POA: Diagnosis present

## 2015-12-26 HISTORY — DX: Essential (primary) hypertension: I10

## 2015-12-26 HISTORY — DX: Gastro-esophageal reflux disease without esophagitis: K21.9

## 2015-12-26 HISTORY — DX: Disorder of thyroid, unspecified: E07.9

## 2015-12-26 HISTORY — DX: Cerebral infarction, unspecified: I63.9

## 2015-12-26 MED ORDER — HYDROCODONE-HOMATROPINE 5-1.5 MG/5ML PO SYRP: 5.0000 mL | ORAL_SOLUTION | Freq: Four times a day (QID) | ORAL | Status: AC | PRN

## 2015-12-26 MED ORDER — DEXAMETHASONE SODIUM PHOSPHATE 10 MG/ML IJ SOLN
10.0000 mg | Freq: Once | INTRAMUSCULAR | Status: AC
  Administered 2015-12-26: 10 mg via INTRAVENOUS
  Filled 2015-12-26: qty 1

## 2015-12-26 MED ORDER — ONDANSETRON 4 MG PO TBDP
4.0000 mg | ORAL_TABLET | Freq: Once | ORAL | Status: AC
  Administered 2015-12-26: 4 mg via ORAL
  Filled 2015-12-26: qty 1

## 2015-12-26 MED ORDER — ONDANSETRON HCL 4 MG PO TABS: 4.0000 mg | ORAL_TABLET | Freq: Three times a day (TID) | ORAL | Status: AC | PRN

## 2015-12-26 NOTE — ED Notes (Signed)
C/o cough non productive  x 1 week  Worse today,  cp radiating to back , pain is  [employer]

## 2015-12-26 NOTE — Discharge Instructions (Signed)
1. Medications: mucinex, cough syrup, zofran as needed for nausea, continue usual home medications 2. Treatment: rest, drink plenty of fluids, take tylenol or ibuprofen for fever control 3. Follow Up: Please follow up with your primary doctor in 3 days for discussion of your diagnoses and further evaluation after today's visit;  if you do not have a primary care doctor use the resource guide provided to find one; Return to the ER for high fevers, difficulty breathing or other concerning symptoms   Emergency Department Resource Guide 1) Find a Doctor and Pay Out of Pocket Although you won't have to find out who is covered by your insurance plan, it is a good idea to ask around and get recommendations. You will then need to call the office and see if the doctor you have chosen will accept you as a new patient and what types of options they offer for patients who are self-pay. Some doctors offer discounts or will set up payment plans for their patients who do not have insurance, but you will need to ask so you aren't surprised when you get to your appointment.  2) Contact Your Local Health Department Not all health departments have doctors that can see patients for sick visits, but many do, so it is worth a call to see if yours does. If you don't know where your local health department is, you can check in your phone book. The CDC also has a tool to help you locate your state's health department, and many state websites also have listings of all of their local health departments.  3) Find a Walk-in Clinic If your illness is not likely to be very severe or complicated, you may want to try a walk in clinic. These are popping up all over the country in pharmacies, drugstores, and shopping centers. They're usually staffed by nurse practitioners or physician assistants that have been trained to treat common illnesses and complaints. They're usually fairly quick and inexpensive. However, if you have serious  medical issues or chronic medical problems, these are probably not your best option.  No Primary Care Doctor: - Call Health Connect at  [phone] - they can help you locate a primary care doctor that  accepts your insurance, provides certain services, etc. - Physician Referral Service- [phone]  Chronic Pain Problems: Organization         Address  Phone   Notes  [facility]  [phone] Patients need to be referred by their primary care doctor.   Medication Assistance: Organization         Address  Phone   Notes  [facility] Medication [facility] [address] [place] [place], [place] [address] [phone] --Must be a resident of [facility] -- Must have NO insurance coverage whatsoever (no Medicaid/ Medicare, etc.) -- The pt. MUST have a primary care doctor that directs their care regularly and follows them in the community   [facility]  [phone]   [employer]  [phone]    Agencies that provide inexpensive medical care: Organization         Address  Phone   Notes  [facility]  [phone]   [facility]    [phone]   [facility] [address] [place], [place] [address] [phone]   [facility] [address] [place] [phone]   [facility]    [phone]   [facility]    [phone]   [facility]  [address], [place] Phone:  [phone], Fax:  [phone] Hours of Operation:  9 am - 6 pm, M-F.  Also accepts Medicaid/Medicare and self-pay.  [facility]  [address], [place] Phone: [phone], Fax: [phone]. Hours of Operation:  8:30 am - 5:30 pm, M-F.  Also accepts Medicaid and self-pay.  [facility] [place] [address] [place] Phone: [phone]   [facility] [address] Natasha Bence  [place], [place] [phone], Ext. [phone] Mondays & Thursdays: 7-9 AM.  First 15 patients are seen on a first come, first serve basis.    Medicaid-accepting [facility] Providers:  Organization         Address  Phone   Notes  [facility] [address], [place] [phone] Also accepts self-pay patients.  [facility] [address] Laurell Josephs [place], [place]  [phone]   [facility] [address], [place] [phone]   [facility] [address], [place] [phone]   Renaye Rakers [address], [place]   [phone] Only accepts [place] patients after they have their name applied to their card.   Self-Pay (no insurance) in [facility]:  Organization         Address  Phone   Notes  Sickle Cell Patients, [facility] [address] [place], [place] [phone]   [facility] [address] [place], [place] [phone]   [facility] [place]  [address], [place] [phone]   [facility]/Dr. Osei-Bonsu  [address], [place] or [address], [place] [phone] Phone number for both [place] and [place] locations is the same.  [facility] [address] [place] [phone]   [facility] [address] [place] or [address] [phone]   [facility] [address], [place] [phone], phone; [phone], fax Sees patients 1st and 3rd Saturday of every month.  Must not qualify for public or private insurance (i.e. Medicaid, Medicare, [place] Health Choice, Veterans' Benefits)  Household income should be no more than 200% of the poverty level The clinic cannot treat you if you are pregnant or think you are pregnant  Sexually  transmitted diseases are not treated at the clinic.

## 2015-12-26 NOTE — ED Notes (Signed)
Cough x 5 days. Sharp pain in her upper chest into her back. Nausea. Headache. Stuffy nose.

## 2015-12-26 NOTE — ED Provider Notes (Signed)
CSN: [id number]     Arrival date & time 12/26/15  1858 History   First MD Initiated Contact with Patient 12/26/15 1906     Chief Complaint  Patient presents with  . Cough     (Consider location/radiation/quality/duration/timing/severity/associated sxs/prior Treatment) The history is provided by the patient and medical records. No language interpreter was used.   Marisa Hoffman is a 45 y.o. female  with a PMH of HTN, prior stroke, gerd who presents to the Emergency Department complaining of persistent nonproductive cough x 1 week. Associated symptoms include sharp chest pain, worse with coughing and deep breathing x 4 days. + nausea, headache, nasal congestion. Denies fevers at home. OTC pain meds taken with little relief. No sick contacts.   Past Medical History  Diagnosis Date  . Hypertension   . Stroke (HCC)   . GERD (gastroesophageal reflux disease)   . Thyroid disease    Past Surgical History  Procedure Laterality Date  . Hernia repair    . Cesarean section    . Lymph node biopsy    . Appendectomy    . Tonsillectomy    . Abdominal hysterectomy     No family history on file. Social History  Substance Use Topics  . Smoking status: Never Smoker   . Smokeless tobacco: None  . Alcohol Use: No   OB History    No data available     Review of Systems  Constitutional: Negative for fever and chills.  HENT: Positive for congestion. Negative for sore throat.   Eyes: Negative for visual disturbance.  Respiratory: Positive for cough. Negative for shortness of breath and wheezing.   Cardiovascular: Positive for chest pain. Negative for palpitations and leg swelling.  Gastrointestinal: Positive for nausea. Negative for vomiting and abdominal pain.  Genitourinary: Negative for dysuria.  Musculoskeletal: Negative for back pain and neck pain.  Skin: Negative for rash.  Neurological: Positive for headaches. Negative for dizziness and weakness.      Allergies  Reglan and  Latex  Home Medications   Prior to Admission medications   Medication Sig Start Date End Date Taking? Authorizing Provider  aspirin 81 MG tablet Take 81 mg by mouth daily.   Yes Historical Provider, MD  atenolol (TENORMIN) 25 MG tablet Take by mouth daily.   Yes Historical Provider, MD  Cetirizine HCl (ZYRTEC PO) Take by mouth.   Yes Historical Provider, MD  DULoxetine HCl (CYMBALTA PO) Take by mouth.   Yes Historical Provider, MD  estradiol (ESTRACE) 1 MG tablet Take 1 mg by mouth daily.   Yes Historical Provider, MD  furosemide (LASIX) 40 MG tablet Take 40 mg by mouth.   Yes Historical Provider, MD  hyoscyamine (LEVSIN) 0.125 MG/5ML ELIX Take 0.125 mg by mouth.   Yes Historical Provider, MD  LamoTRIgine (LAMICTAL PO) Take by mouth.   Yes Historical Provider, MD  Levothyroxine Sodium (SYNTHROID PO) Take by mouth.   Yes Historical Provider, MD  MECLIZINE HCL PO Take by mouth.   Yes Historical Provider, MD  naproxen (NAPROSYN) 500 MG tablet Take 500 mg by mouth 2 (two) times daily with a meal.   Yes Historical Provider, MD  OXcarbazepine (TRILEPTAL PO) Take by mouth.   Yes Historical Provider, MD  pantoprazole (PROTONIX) 40 MG tablet Take 40 mg by mouth daily.   Yes Historical Provider, MD  pentosan polysulfate (ELMIRON) 100 MG capsule Take 100 mg by mouth 3 (three) times daily.   Yes Historical Provider, MD  POTASSIUM CHLORIDE PO  Take by mouth.   Yes Historical Provider, MD  QUEtiapine Fumarate (SEROQUEL PO) Take by mouth.   Yes Historical Provider, MD  tiZANidine (ZANAFLEX) 4 MG tablet Take 4 mg by mouth every 6 (six) hours as needed for muscle spasms.   Yes Historical Provider, MD  HYDROcodone-homatropine (HYCODAN) 5-1.5 MG/5ML syrup Take 5 mLs by mouth every 6 (six) hours as needed for cough. 12/26/15   Chase Picket Odin Mariani, PA-C  ondansetron (ZOFRAN) 4 MG tablet Take 1 tablet (4 mg total) by mouth every 8 (eight) hours as needed for nausea or vomiting. 12/26/15   [facility] Gracie Gupta, PA-C   BP  151/81 mmHg  Pulse 64  Temp(Src) 98.5 F (36.9 C) (Oral)  Resp 20  Ht  (1.575 m)  Wt 77.111 kg  BMI 31.09 kg/m2  SpO2 100% Physical Exam  Constitutional: She is oriented to person, place, and time. She appears well-developed and well-nourished.  Alert and in no acute distress  HENT:  Head: Normocephalic and atraumatic.  Nose: Right sinus exhibits no maxillary sinus tenderness and no frontal sinus tenderness. Left sinus exhibits no maxillary sinus tenderness and no frontal sinus tenderness.  OP with erythema, no exudates.  + nasal congestion.   Cardiovascular: Normal rate, regular rhythm, normal heart sounds and intact distal pulses.  Exam reveals no gallop and no friction rub.   No murmur heard. Pulmonary/Chest: Effort normal and breath sounds normal. No respiratory distress. She has no wheezes. She has no rales. She exhibits tenderness.  Abdominal: Soft. Bowel sounds are normal. She exhibits no distension and no mass. There is no tenderness. There is no rebound and no guarding.  Musculoskeletal: She exhibits no edema.  Neurological: She is alert and oriented to person, place, and time.  Skin: Skin is warm and dry. No rash noted.  Psychiatric: She has a normal mood and affect. Her behavior is normal. Judgment and thought content normal.  Nursing note and vitals reviewed.   ED Course  Procedures (including critical care time) Labs Review Labs Reviewed - No data to display  Imaging Review Dg Chest 2 View  12/26/2015  CLINICAL DATA:  Fever and cough for 2 days. History of stroke and hypertension. EXAM: CHEST  2 VIEW COMPARISON:  02/29/2004 FINDINGS: Normal heart, mediastinum and hila. Clear lungs.  No pleural effusion or pneumothorax. Surgical vascular clips along the left axilla. Bony thorax is intact. IMPRESSION: No active cardiopulmonary disease. Electronically Signed   By: Amie Portland M.D.   On: 12/26/2015 19:53   I have personally reviewed and evaluated these images and  lab results as part of my medical decision-making.   EKG Interpretation None      MDM   Final diagnoses:  Upper respiratory infection   Marisa Hoffman is afebrile, non-toxic appearing with a clear lung exam. Mild rhinorrhea and OP with erythema, no exudates. Likely viral URI. Chest pain is pleuritic with chest wall tenderness on exam. CXR with no acute abnormalities. Patient is agreeable to symptomatic treatment with close follow up with PCP as needed but spoke at length about emergent, changing, or worsening of symptoms that should prompt return to ER. Patient voices understanding and is agreeable to plan.   Blood pressure 151/81, pulse 64, temperature 98.5 F (36.9 C), temperature source Oral, resp. rate 20, height  (1.575 m), weight 77.111 kg, SpO2 100 %  [facility], PA-C 12/27/15 6440  Doug Sou, MD 12/28/15 [phone]

## 2015-12-26 NOTE — ED Notes (Signed)
Pt given d/c instructions as per chart. Verbalizes understanding. No questions. Rx x 2. 

## 2017-08-23 ENCOUNTER — Emergency Department (HOSPITAL_BASED_OUTPATIENT_CLINIC_OR_DEPARTMENT_OTHER): Payer: Medicaid Other

## 2017-08-23 ENCOUNTER — Emergency Department (HOSPITAL_BASED_OUTPATIENT_CLINIC_OR_DEPARTMENT_OTHER)
Admission: EM | Admit: 2017-08-23 | Discharge: 2017-08-23 | Disposition: A | Discharge status: Home / Self Care | Payer: Medicaid Other | Attending: Emergency Medicine | Admitting: Emergency Medicine

## 2017-08-23 ENCOUNTER — Encounter (HOSPITAL_BASED_OUTPATIENT_CLINIC_OR_DEPARTMENT_OTHER): Payer: Self-pay | Admitting: Emergency Medicine

## 2017-08-23 DIAGNOSIS — R0600 Dyspnea, unspecified: Secondary | ICD-10-CM | POA: Diagnosis not present

## 2017-08-23 DIAGNOSIS — R079 Chest pain, unspecified: Secondary | ICD-10-CM | POA: Insufficient documentation

## 2017-08-23 DIAGNOSIS — R05 Cough: Secondary | ICD-10-CM | POA: Insufficient documentation

## 2017-08-23 NOTE — ED Notes (Signed)
Per registration, pt left.  

## 2017-08-23 NOTE — ED Triage Notes (Signed)
Patient reports that she has had coarse and productive cough for 3 days  - the patient has a rasp voice and is having pain with a a cough. Patient also has some nasal congestion

## 2021-12-08 ENCOUNTER — Other Ambulatory Visit: Payer: Self-pay

## 2021-12-08 ENCOUNTER — Emergency Department (HOSPITAL_COMMUNITY): Payer: Medicaid Other

## 2021-12-08 ENCOUNTER — Encounter (HOSPITAL_COMMUNITY): Payer: Self-pay | Admitting: Emergency Medicine

## 2021-12-08 ENCOUNTER — Emergency Department (HOSPITAL_COMMUNITY)
Admission: EM | Admit: 2021-12-08 | Discharge: 2021-12-08 | Disposition: A | Discharge status: Home / Self Care | Payer: Medicaid Other | Attending: Emergency Medicine | Admitting: Emergency Medicine

## 2021-12-08 DIAGNOSIS — R109 Unspecified abdominal pain: Secondary | ICD-10-CM | POA: Insufficient documentation

## 2021-12-08 DIAGNOSIS — S8991XA Unspecified injury of right lower leg, initial encounter: Secondary | ICD-10-CM | POA: Insufficient documentation

## 2021-12-08 DIAGNOSIS — M25572 Pain in left ankle and joints of left foot: Secondary | ICD-10-CM | POA: Diagnosis not present

## 2021-12-08 DIAGNOSIS — I10 Essential (primary) hypertension: Secondary | ICD-10-CM | POA: Diagnosis not present

## 2021-12-08 DIAGNOSIS — S8992XA Unspecified injury of left lower leg, initial encounter: Secondary | ICD-10-CM | POA: Insufficient documentation

## 2021-12-08 DIAGNOSIS — E039 Hypothyroidism, unspecified: Secondary | ICD-10-CM | POA: Diagnosis not present

## 2021-12-08 DIAGNOSIS — M25571 Pain in right ankle and joints of right foot: Secondary | ICD-10-CM | POA: Insufficient documentation

## 2021-12-08 DIAGNOSIS — G8911 Acute pain due to trauma: Secondary | ICD-10-CM | POA: Diagnosis not present

## 2021-12-08 LAB — CBC WITH DIFFERENTIAL/PLATELET
Abs Immature Granulocytes: 0.02 10*3/uL (ref 0.00–0.07)
Basophils Absolute: 0 10*3/uL (ref 0.0–0.1)
Basophils Relative: 1 %
Eosinophils Absolute: 0.1 10*3/uL (ref 0.0–0.5)
Eosinophils Relative: 2 %
HCT: 35.2 % — ABNORMAL LOW (ref 36.0–46.0)
Hemoglobin: 12 g/dL (ref 12.0–15.0)
Immature Granulocytes: 0 %
Lymphocytes Relative: 24 %
Lymphs Abs: 1.2 10*3/uL (ref 0.7–4.0)
MCH: 32.3 pg (ref 26.0–34.0)
MCHC: 34.1 g/dL (ref 30.0–36.0)
MCV: 94.9 fL (ref 80.0–100.0)
Monocytes Absolute: 0.4 10*3/uL (ref 0.1–1.0)
Monocytes Relative: 8 %
Neutro Abs: 3.4 10*3/uL (ref 1.7–7.7)
Neutrophils Relative %: 65 %
Platelets: 134 10*3/uL — ABNORMAL LOW (ref 150–400)
RBC: 3.71 MIL/uL — ABNORMAL LOW (ref 3.87–5.11)
RDW: 13.3 % (ref 11.5–15.5)
WBC: 5.2 10*3/uL (ref 4.0–10.5)
nRBC: 0 % (ref 0.0–0.2)

## 2021-12-08 LAB — COMPREHENSIVE METABOLIC PANEL
ALT: 28 U/L (ref 0–44)
AST: 33 U/L (ref 15–41)
Albumin: 3.8 g/dL (ref 3.5–5.0)
Alkaline Phosphatase: 82 U/L (ref 38–126)
Anion gap: 10 (ref 5–15)
BUN: 12 mg/dL (ref 6–20)
CO2: 23 mmol/L (ref 22–32)
Calcium: 8.8 mg/dL — ABNORMAL LOW (ref 8.9–10.3)
Chloride: 104 mmol/L (ref 98–111)
Creatinine, Ser: 0.6 mg/dL (ref 0.44–1.00)
GFR, Estimated: >60 mL/min (ref >60)
Glucose, Bld: 126 mg/dL — ABNORMAL HIGH (ref 70–99)
Potassium: 4.1 mmol/L (ref 3.5–5.1)
Sodium: 137 mmol/L (ref 135–145)
Total Bilirubin: 0.6 mg/dL (ref 0.3–1.2)
Total Protein: 7 g/dL (ref 6.5–8.1)

## 2021-12-08 MED ORDER — ACETAMINOPHEN 325 MG PO TABS
650.0000 mg | ORAL_TABLET | Freq: Once | ORAL | Status: AC
  Administered 2021-12-08: 650 mg via ORAL
  Filled 2021-12-08: qty 2

## 2021-12-08 MED ORDER — CYCLOBENZAPRINE HCL 10 MG PO TABS
10.0000 mg | ORAL_TABLET | Freq: Once | ORAL | Status: AC
Start: 2021-12-08 — End: 2021-12-08
  Administered 2021-12-08: 10 mg via ORAL
  Filled 2021-12-08: qty 1

## 2021-12-08 MED ORDER — IOHEXOL 300 MG/ML  SOLN
100.0000 mL | Freq: Once | INTRAMUSCULAR | Status: AC | PRN
  Administered 2021-12-08: 100 mL via INTRAVENOUS

## 2021-12-08 MED ORDER — KETOROLAC TROMETHAMINE 15 MG/ML IJ SOLN
15.0000 mg | Freq: Once | INTRAMUSCULAR | Status: AC
Start: 2021-12-08 — End: 2021-12-08
  Administered 2021-12-08: 15 mg via INTRAVENOUS
  Filled 2021-12-08: qty 1

## 2021-12-08 MED ORDER — ONDANSETRON HCL 4 MG/2ML IJ SOLN
4.0000 mg | Freq: Once | INTRAMUSCULAR | Status: AC
Start: 2021-12-08 — End: 2021-12-08
  Administered 2021-12-08: 4 mg via INTRAVENOUS
  Filled 2021-12-08: qty 2

## 2021-12-08 NOTE — ED Notes (Signed)
Patient transported to X-ray 
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
## 2021-12-08 NOTE — ED Notes (Signed)
Pt. returned from XR. 

## 2021-12-08 NOTE — ED Notes (Signed)
Patient discharge instructions reviewed with the patient. The patient verbalized understanding of instructions. Patient discharged. 
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
## 2021-12-08 NOTE — Discharge Instructions (Addendum)
-   Please follow-up with your primary care provider for routine care.  Please treat any pain with Tylenol and ibuprofen. Please return to the emergency department if you have acute onset shortness of breath, chest pain, or feel as if you need to be reevaluated

## 2021-12-08 NOTE — ED Notes (Signed)
Patient wheeled to the bathroom. Patient is c/o pain , but is able to bear weigh successfully with assistance and encouragement.
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
## 2022-07-31 ENCOUNTER — Other Ambulatory Visit: Payer: Self-pay | Admitting: Surgery

## 2022-07-31 DIAGNOSIS — N6452 Nipple discharge: Secondary | ICD-10-CM
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
